# Patient Record
Sex: Male | Born: 1990 | Race: White | Hispanic: No | Marital: Married | Smoking: Never smoker
Health system: Southern US, Community
[De-identification: ages and names within clinical notes are randomized; demographics above are authoritative.]

## PROBLEM LIST (undated history)

## (undated) DIAGNOSIS — F319 Bipolar disorder, unspecified: Secondary | ICD-10-CM

## (undated) DIAGNOSIS — I10 Essential (primary) hypertension: Secondary | ICD-10-CM

## (undated) HISTORY — PX: APPENDECTOMY: SHX54

---

## 2015-03-25 ENCOUNTER — Encounter (HOSPITAL_BASED_OUTPATIENT_CLINIC_OR_DEPARTMENT_OTHER): Payer: Self-pay | Admitting: *Deleted

## 2015-03-25 ENCOUNTER — Emergency Department (HOSPITAL_BASED_OUTPATIENT_CLINIC_OR_DEPARTMENT_OTHER): Payer: Self-pay

## 2015-03-25 ENCOUNTER — Emergency Department (HOSPITAL_BASED_OUTPATIENT_CLINIC_OR_DEPARTMENT_OTHER)
Admission: EM | Admit: 2015-03-25 | Discharge: 2015-03-25 | Disposition: A | Discharge status: Home / Self Care | Payer: Self-pay | Attending: Emergency Medicine | Admitting: Emergency Medicine

## 2015-03-25 DIAGNOSIS — S40812A Abrasion of left upper arm, initial encounter: Secondary | ICD-10-CM | POA: Insufficient documentation

## 2015-03-25 DIAGNOSIS — S52122A Displaced fracture of head of left radius, initial encounter for closed fracture: Secondary | ICD-10-CM

## 2015-03-25 DIAGNOSIS — I10 Essential (primary) hypertension: Secondary | ICD-10-CM | POA: Insufficient documentation

## 2015-03-25 DIAGNOSIS — Z79899 Other long term (current) drug therapy: Secondary | ICD-10-CM | POA: Insufficient documentation

## 2015-03-25 DIAGNOSIS — Y998 Other external cause status: Secondary | ICD-10-CM | POA: Insufficient documentation

## 2015-03-25 DIAGNOSIS — W19XXXA Unspecified fall, initial encounter: Secondary | ICD-10-CM

## 2015-03-25 DIAGNOSIS — Y9389 Activity, other specified: Secondary | ICD-10-CM | POA: Insufficient documentation

## 2015-03-25 DIAGNOSIS — Y9289 Other specified places as the place of occurrence of the external cause: Secondary | ICD-10-CM | POA: Insufficient documentation

## 2015-03-25 DIAGNOSIS — S52125A Nondisplaced fracture of head of left radius, initial encounter for closed fracture: Secondary | ICD-10-CM | POA: Insufficient documentation

## 2015-03-25 DIAGNOSIS — W1839XA Other fall on same level, initial encounter: Secondary | ICD-10-CM | POA: Insufficient documentation

## 2015-03-25 DIAGNOSIS — Z8659 Personal history of other mental and behavioral disorders: Secondary | ICD-10-CM | POA: Insufficient documentation

## 2015-03-25 HISTORY — DX: Bipolar disorder, unspecified: F31.9

## 2015-03-25 HISTORY — DX: Essential (primary) hypertension: I10
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
## 2015-06-24 ENCOUNTER — Ambulatory Visit: Payer: Self-pay | Admitting: Family Medicine

## 2016-02-28 ENCOUNTER — Encounter (HOSPITAL_BASED_OUTPATIENT_CLINIC_OR_DEPARTMENT_OTHER): Payer: Self-pay | Admitting: Emergency Medicine

## 2016-02-28 ENCOUNTER — Emergency Department (HOSPITAL_BASED_OUTPATIENT_CLINIC_OR_DEPARTMENT_OTHER): Payer: BLUE CROSS/BLUE SHIELD

## 2016-02-28 ENCOUNTER — Emergency Department (HOSPITAL_BASED_OUTPATIENT_CLINIC_OR_DEPARTMENT_OTHER)
Admission: EM | Admit: 2016-02-28 | Discharge: 2016-02-28 | Disposition: A | Discharge status: Home / Self Care | Payer: BLUE CROSS/BLUE SHIELD | Attending: Emergency Medicine | Admitting: Emergency Medicine

## 2016-02-28 DIAGNOSIS — Y9389 Activity, other specified: Secondary | ICD-10-CM | POA: Insufficient documentation

## 2016-02-28 DIAGNOSIS — R0781 Pleurodynia: Secondary | ICD-10-CM | POA: Diagnosis present

## 2016-02-28 DIAGNOSIS — Y92410 Unspecified street and highway as the place of occurrence of the external cause: Secondary | ICD-10-CM | POA: Diagnosis not present

## 2016-02-28 DIAGNOSIS — I10 Essential (primary) hypertension: Secondary | ICD-10-CM | POA: Insufficient documentation

## 2016-02-28 DIAGNOSIS — Y999 Unspecified external cause status: Secondary | ICD-10-CM | POA: Diagnosis not present

## 2016-02-28 DIAGNOSIS — S20222A Contusion of left back wall of thorax, initial encounter: Secondary | ICD-10-CM | POA: Diagnosis not present

## 2016-02-28 DIAGNOSIS — Z79899 Other long term (current) drug therapy: Secondary | ICD-10-CM | POA: Diagnosis not present

## 2016-02-28 DIAGNOSIS — S40012A Contusion of left shoulder, initial encounter: Secondary | ICD-10-CM | POA: Diagnosis not present

## 2016-02-28 LAB — CBC WITH DIFFERENTIAL/PLATELET
Basophils Absolute: 0 10*3/uL (ref 0.0–0.1)
Basophils Relative: 0 %
Eosinophils Absolute: 0.2 10*3/uL (ref 0.0–0.7)
Eosinophils Relative: 2 %
HEMATOCRIT: 42 % (ref 39.0–52.0)
HEMOGLOBIN: 14.9 g/dL (ref 13.0–17.0)
LYMPHS PCT: 19 %
Lymphs Abs: 2.2 10*3/uL (ref 0.7–4.0)
MCH: 28.2 pg (ref 26.0–34.0)
MCHC: 35.5 g/dL (ref 30.0–36.0)
MCV: 79.5 fL (ref 78.0–100.0)
MONO ABS: 0.9 10*3/uL (ref 0.1–1.0)
Monocytes Relative: 8 %
NEUTROS ABS: 8.2 10*3/uL — AB (ref 1.7–7.7)
NEUTROS PCT: 71 %
Platelets: 180 10*3/uL (ref 150–400)
RBC: 5.28 MIL/uL (ref 4.22–5.81)
RDW: 14.3 % (ref 11.5–15.5)
WBC: 11.5 10*3/uL — ABNORMAL HIGH (ref 4.0–10.5)

## 2016-02-28 LAB — BASIC METABOLIC PANEL
ANION GAP: 10 (ref 5–15)
BUN: 14 mg/dL (ref 6–20)
CALCIUM: 9.3 mg/dL (ref 8.9–10.3)
CHLORIDE: 103 mmol/L (ref 101–111)
CO2: 22 mmol/L (ref 22–32)
CREATININE: 1.13 mg/dL (ref 0.61–1.24)
GFR calc non Af Amer: >60 mL/min (ref >60)
GLUCOSE: 103 mg/dL — AB (ref 65–99)
Potassium: 3.7 mmol/L (ref 3.5–5.1)
Sodium: 135 mmol/L (ref 135–145)

## 2016-02-28 MED ORDER — IOPAMIDOL (ISOVUE-300) INJECTION 61%
100.0000 mL | Freq: Once | INTRAVENOUS | Status: AC | PRN
  Administered 2016-02-28: 100 mL via INTRAVENOUS

## 2016-02-28 MED ORDER — SODIUM CHLORIDE 0.9 % IV BOLUS (SEPSIS)
1000.0000 mL | Freq: Once | INTRAVENOUS | Status: AC
  Administered 2016-02-28: 1000 mL via INTRAVENOUS

## 2016-02-28 MED ORDER — FENTANYL CITRATE (PF) 100 MCG/2ML IJ SOLN
100.0000 ug | Freq: Once | INTRAMUSCULAR | Status: AC
  Administered 2016-02-28: 100 ug via INTRAVENOUS
  Filled 2016-02-28: qty 2
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
## 2016-02-28 NOTE — ED Notes (Signed)
Returned from CT.

## 2016-02-28 NOTE — ED Notes (Signed)
Pt in CT.

## 2016-02-28 NOTE — ED Notes (Signed)
MD with pt  
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
